# Patient Record
Sex: Female | Born: 1995 | Race: White | Hispanic: No | Marital: Single | Smoking: Never smoker
Health system: Southern US, Community
[De-identification: ages and names within clinical notes are randomized; demographics above are authoritative.]

## PROBLEM LIST (undated history)

## (undated) DIAGNOSIS — M25373 Other instability, unspecified ankle: Secondary | ICD-10-CM

## (undated) HISTORY — PX: TONSILLECTOMY: SUR1361

---
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
## 2016-07-24 ENCOUNTER — Emergency Department: Payer: BLUE CROSS/BLUE SHIELD

## 2016-07-24 ENCOUNTER — Encounter: Payer: Self-pay | Admitting: Emergency Medicine

## 2016-07-24 ENCOUNTER — Emergency Department
Admission: EM | Admit: 2016-07-24 | Discharge: 2016-07-25 | Disposition: A | Discharge status: Home / Self Care | Payer: BLUE CROSS/BLUE SHIELD | Attending: Emergency Medicine | Admitting: Emergency Medicine

## 2016-07-24 DIAGNOSIS — J4521 Mild intermittent asthma with (acute) exacerbation: Secondary | ICD-10-CM | POA: Diagnosis not present

## 2016-07-24 DIAGNOSIS — Z79899 Other long term (current) drug therapy: Secondary | ICD-10-CM | POA: Insufficient documentation

## 2016-07-24 DIAGNOSIS — R0602 Shortness of breath: Secondary | ICD-10-CM | POA: Diagnosis present

## 2016-07-24 MED ORDER — IPRATROPIUM-ALBUTEROL 0.5-2.5 (3) MG/3ML IN SOLN
RESPIRATORY_TRACT | Status: AC
  Filled 2016-07-24: qty 3

## 2016-07-24 MED ORDER — IPRATROPIUM-ALBUTEROL 0.5-2.5 (3) MG/3ML IN SOLN
3.0000 mL | Freq: Once | RESPIRATORY_TRACT | Status: AC
  Administered 2016-07-25: 3 mL via RESPIRATORY_TRACT

## 2016-07-24 MED ORDER — PREDNISONE 20 MG PO TABS
ORAL_TABLET | ORAL | Status: AC
  Administered 2016-07-25: 60 mg via ORAL
  Filled 2016-07-24: qty 3

## 2016-07-24 MED ORDER — IPRATROPIUM-ALBUTEROL 0.5-2.5 (3) MG/3ML IN SOLN
3.0000 mL | Freq: Once | RESPIRATORY_TRACT | Status: AC
  Administered 2016-07-24: 3 mL via RESPIRATORY_TRACT

## 2016-07-24 MED ORDER — IPRATROPIUM-ALBUTEROL 0.5-2.5 (3) MG/3ML IN SOLN
RESPIRATORY_TRACT | Status: AC
  Administered 2016-07-25: 3 mL via RESPIRATORY_TRACT
  Filled 2016-07-24: qty 3

## 2016-07-24 MED ORDER — PREDNISONE 20 MG PO TABS
60.0000 mg | ORAL_TABLET | Freq: Once | ORAL | Status: AC
  Administered 2016-07-25: 60 mg via ORAL

## 2016-07-24 NOTE — ED Notes (Signed)
Pt reports hx of asthma. Pt c/o SOB beginning yesterday with throat/chest tightness. Pt c/o productive cough beginning today. Pt reports she did not look at sputum, so cannot report the color.  Pt reports she has been taking albuterol treatments and inhaler at home with no relief. Pt was given duoneb in triage - pt still has inspiratory wheeze.

## 2016-07-24 NOTE — ED Triage Notes (Signed)
Pt presents to ED with an asthma attack. Pt states she first started wheezing yesterday and has used her inhaler and nebulizer all day today with no relief. symptoms worsened today. Cough present and pt reports slight nasal congestion as well. slight increased work of breathing noted at this time.

## 2016-07-25 MED ORDER — ALBUTEROL SULFATE (2.5 MG/3ML) 0.083% IN NEBU: 2.5000 mg | INHALATION_SOLUTION | RESPIRATORY_TRACT | 0 refills | Status: AC | PRN

## 2016-07-25 MED ORDER — HYDROCOD POLST-CPM POLST ER 10-8 MG/5ML PO SUER: 5.0000 mL | Freq: Two times a day (BID) | ORAL | 0 refills | Status: AC

## 2016-07-25 MED ORDER — PREDNISONE 20 MG PO TABS
ORAL_TABLET | ORAL | Status: AC
  Filled 2016-07-25: qty 1

## 2016-07-25 MED ORDER — ALBUTEROL SULFATE HFA 108 (90 BASE) MCG/ACT IN AERS: 2.0000 | INHALATION_SPRAY | RESPIRATORY_TRACT | 0 refills | Status: AC | PRN

## 2016-07-25 MED ORDER — PREDNISONE 20 MG PO TABS: ORAL_TABLET | ORAL | 0 refills | Status: DC

## 2016-07-25 MED ORDER — IPRATROPIUM-ALBUTEROL 0.5-2.5 (3) MG/3ML IN SOLN
RESPIRATORY_TRACT | Status: AC
  Administered 2016-07-25: 3 mL via RESPIRATORY_TRACT
  Filled 2016-07-25: qty 3

## 2016-07-25 MED ORDER — HYDROCOD POLST-CPM POLST ER 10-8 MG/5ML PO SUER
5.0000 mL | Freq: Once | ORAL | Status: AC
  Administered 2016-07-25: 5 mL via ORAL
  Filled 2016-07-25: qty 5

## 2016-07-25 MED ORDER — IPRATROPIUM-ALBUTEROL 0.5-2.5 (3) MG/3ML IN SOLN
3.0000 mL | Freq: Once | RESPIRATORY_TRACT | Status: AC
  Administered 2016-07-25: 3 mL via RESPIRATORY_TRACT

## 2016-07-25 NOTE — ED Notes (Addendum)
RN to room to d/c patient. Pt c/o increasing SOB. Pt had inspiratory wheezing left chest. Pt c/o left chest pain described as shooting rated as a 5 out 10. MD Dolores FrameSung notified

## 2016-07-25 NOTE — Discharge Instructions (Signed)
1. Take prednisone 60mg  daily x 4 days.  2. Use albuterol nebulizer or inhaler every 4 hours as needed for wheezing. 3. Return to the ER for worsening symptoms, persistent vomiting, difficulty breathing or other concerns.

## 2016-07-25 NOTE — ED Provider Notes (Signed)
[facility] Emergency Department Provider Note   ____________________________________________   First MD Initiated Contact with Patient 07/25/16 0018     (approximate)  I have reviewed the triage vital signs and the nursing notes.   HISTORY  Chief Complaint Asthma    HPI Suzanne Hancock is a 20 y.o. female who presents to the ED from home with a chief complaint of asthma exacerbation. Patient reports she started wheezing with shortness of breath yesterday and has been using her inhaler and nebulizer without relief. Complains of dry cough and nasal congestion. Denies associated fever, chills, chest pain, abdominal pain, nausea, vomiting, diarrhea. Denies recent travel or trauma. Nothing makes her symptoms better or worse.Last hospitalization for asthma as a child. Never intubated.   Past Medical History:  Diagnosis Date  . Asthma    daily inhaler  . 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   Past Surgical History:  Procedure Laterality Date  . ANKLE RECONSTRUCTION  09/08/2011   Procedure: RECONSTRUCTION ANKLE;  Surgeon: Harvie JuniorJohn L Graves;  Location: [facility];  Service: Orthopedics;  Laterality: Left;  brostrom procedure   . TONSILLECTOMY      Prior to Admission medications   Medication Sig Start Date End Date Taking? Authorizing Provider  fluticasone (FLOVENT HFA) 110 MCG/ACT inhaler Inhale 2 puffs into the lungs daily.     Yes Historical Provider, MD  Norethindrone Acetate-Ethinyl Estradiol (GILDESS 1.5/30) 1.5-30 MG-MCG tablet Take 1 tablet by mouth daily.   Yes Historical Provider, MD  albuterol (PROVENTIL HFA;VENTOLIN HFA) 108 (90 Base) MCG/ACT inhaler Inhale 2 puffs into the lungs every 4 (four) hours as needed for wheezing or shortness of breath. 07/25/16   Irean HongJade J Dodi Leu, MD  albuterol (PROVENTIL) (2.5 MG/3ML) 0.083% nebulizer solution Take 3 mLs (2.5 mg total) by nebulization  every 4 (four) hours as needed for wheezing or shortness of breath. 07/25/16   Irean HongJade J Enzley Kitchens, MD  FLUARIX QUADRIVALENT 0.5 ML injection Inject 1 Dose into the muscle once. 05/23/16   Historical Provider, MD  predniSONE (DELTASONE) 20 MG tablet 3 tablets daily x 4 days 07/25/16   Irean HongJade J Legaci Tarman, MD    Allergies Other and Penicillins  Family History  Problem Relation Age of Onset  . Hypertension Mother   . Asthma Mother     as a child    Social History Social History  Substance Use Topics  . Smoking status: Never Smoker  . Smokeless tobacco: Never Used  . Alcohol use Yes    Review of Systems  Constitutional: No fever/chills. Eyes: No visual changes. ENT: No sore throat. Cardiovascular: Denies chest pain. Respiratory: Positive for dry cough, wheezing and shortness of breath. Gastrointestinal: No abdominal pain.  No nausea, no vomiting.  No diarrhea.  No constipation. Genitourinary: Negative for dysuria. Musculoskeletal: Negative for back pain. Skin: Negative for rash. Neurological: Negative for headaches, focal weakness or numbness.  10-point ROS otherwise negative.  ____________________________________________   PHYSICAL EXAM:  VITAL SIGNS: ED Triage Vitals  Enc Vitals Group     BP 07/24/16 2145 133/83     Pulse Rate 07/24/16 2145 90     Resp 07/24/16 2145 16     Temp 07/24/16 2145 98.2 F (36.8 C)     Temp Source 07/24/16 2145 Oral     SpO2 07/24/16 2145 97 %     Weight 07/24/16 2143 120 lb (54.4 kg)  Height 07/24/16 2143 5' 1.5" (1.562 m)     Head Circumference --      Peak Flow --      Pain Score 07/24/16 2145 0     Pain Loc --      Pain Edu? --      Excl. in GC? --    Examined after second DuoNeb treatment: Constitutional: Alert and oriented. Well appearing and in no acute distress. Eyes: Conjunctivae are normal. PERRL. EOMI. Head: Atraumatic. Nose: No congestion/rhinnorhea. Mouth/Throat: Mucous membranes are moist.  Oropharynx  non-erythematous. Neck: No stridor.   Cardiovascular: Mildly tachycardic rate, regular rhythm. Grossly normal heart sounds.  Good peripheral circulation. Respiratory: Normal respiratory effort.  No retractions. Lungs CTAB without wheezing. Gastrointestinal: Soft and nontender. No distention. No abdominal bruits. No CVA tenderness. Musculoskeletal: No lower extremity tenderness nor edema.  No joint effusions. Neurologic:  Normal speech and language. No gross focal neurologic deficits are appreciated. No gait instability. Skin:  Skin is warm, dry and intact. No rash noted. Psychiatric: Mood and affect are normal. Speech and behavior are normal.  ____________________________________________   LABS (all labs ordered are listed, but only abnormal results are displayed)  Labs Reviewed - No data to display ____________________________________________  EKG  None ____________________________________________  RADIOLOGY  Chest 2 view (viewed by me, interpreted per Dr. Earlene Plateravis): No active cardiopulmonary disease. ____________________________________________   PROCEDURES  Procedure(s) performed: None  Procedures  Critical Care performed: No  ____________________________________________   INITIAL IMPRESSION / ASSESSMENT AND PLAN / ED COURSE  Pertinent labs & imaging results that were available during my care of the patient were reviewed by me and considered in my medical decision making (see chart for details).  20 year old female with a history of asthma who presents with exacerbation. She is feeling and sounding better after second DuoNeb. Received 60mg  oral prednisone. We'll continue to monitor and reassess.  Clinical Course as of Jul 26 707  Wynelle LinkSun Jul 25, 2016  0123 Lungs remain clear with good aeration without wheezing. Room air saturations 98%. Will continue patient on prednisone for a 5 day burst. Will refill prescriptions for albuterol nebulizer and inhaler. Strict return  precautions given. Patient verbalizes understanding and agrees with plan of care.  [JS]  0342 Patient's discharge was held secondary to recurrent wheezing. She had a third DuoNeb and observed for 2 additional hours. Overall she is feeling better; I did offer hospitalization but patient desires to go home. Strict return precautions given. Patient verbalizes understanding and agrees with plan of care.  [JS]    Clinical Course User Index [JS] Irean HongJade J Tekeya Geffert, MD     ____________________________________________   FINAL CLINICAL IMPRESSION(S) / ED DIAGNOSES  Final diagnoses:  Mild intermittent asthma with exacerbation      NEW MEDICATIONS STARTED DURING THIS VISIT:  New Prescriptions   ALBUTEROL (PROVENTIL HFA;VENTOLIN HFA) 108 (90 BASE) MCG/ACT INHALER    Inhale 2 puffs into the lungs every 4 (four) hours as needed for wheezing or shortness of breath.   ALBUTEROL (PROVENTIL) (2.5 MG/3ML) 0.083% NEBULIZER SOLUTION    Take 3 mLs (2.5 mg total) by nebulization every 4 (four) hours as needed for wheezing or shortness of breath.   PREDNISONE (DELTASONE) 20 MG TABLET    3 tablets daily x 4 days     Note:  This document was prepared using Dragon voice recognition software and may include unintentional dictation errors.    Irean HongJade J Salimatou Simone, MD 07/25/16 [phone]

## 2016-07-25 NOTE — ED Notes (Signed)
Report from jenna, rn.  

## 2016-07-25 NOTE — ED Notes (Signed)
Pt with clear breath sounds in all lung fields with exception of beginning of branch of bronchi. Pt states she "feels the same". Skin pwd, resps are unlabored at this time. Pt denies further needs.

## 2019-08-26 ENCOUNTER — Encounter: Payer: Self-pay | Admitting: Emergency Medicine

## 2019-08-26 ENCOUNTER — Emergency Department: Payer: Managed Care, Other (non HMO)

## 2019-08-26 ENCOUNTER — Other Ambulatory Visit: Payer: Self-pay

## 2019-08-26 DIAGNOSIS — Z20828 Contact with and (suspected) exposure to other viral communicable diseases: Secondary | ICD-10-CM | POA: Insufficient documentation

## 2019-08-26 DIAGNOSIS — Z79899 Other long term (current) drug therapy: Secondary | ICD-10-CM | POA: Insufficient documentation

## 2019-08-26 DIAGNOSIS — M79662 Pain in left lower leg: Secondary | ICD-10-CM | POA: Diagnosis not present

## 2019-08-26 DIAGNOSIS — B349 Viral infection, unspecified: Secondary | ICD-10-CM | POA: Diagnosis not present

## 2019-08-26 DIAGNOSIS — J45901 Unspecified asthma with (acute) exacerbation: Secondary | ICD-10-CM | POA: Diagnosis not present

## 2019-08-26 DIAGNOSIS — R079 Chest pain, unspecified: Secondary | ICD-10-CM | POA: Diagnosis present

## 2019-08-26 LAB — BASIC METABOLIC PANEL
Anion gap: 9 (ref 5–15)
BUN: 6 mg/dL (ref 6–20)
CO2: 25 mmol/L (ref 22–32)
Calcium: 9.1 mg/dL (ref 8.9–10.3)
Chloride: 106 mmol/L (ref 98–111)
Creatinine, Ser: 0.68 mg/dL (ref 0.44–1.00)
GFR calc Af Amer: >60 mL/min (ref >60)
GFR calc non Af Amer: >60 mL/min (ref >60)
Glucose, Bld: 97 mg/dL (ref 70–99)
Potassium: 3.7 mmol/L (ref 3.5–5.1)
Sodium: 140 mmol/L (ref 135–145)

## 2019-08-26 LAB — POCT PREGNANCY, URINE: Preg Test, Ur: NEGATIVE

## 2019-08-26 LAB — CBC
HCT: 42.6 % (ref 36.0–46.0)
Hemoglobin: 14.5 g/dL (ref 12.0–15.0)
MCH: 28.5 pg (ref 26.0–34.0)
MCHC: 34 g/dL (ref 30.0–36.0)
MCV: 83.7 fL (ref 80.0–100.0)
Platelets: 355 10*3/uL (ref 150–400)
RBC: 5.09 MIL/uL (ref 3.87–5.11)
RDW: 12.1 % (ref 11.5–15.5)
WBC: 9.2 10*3/uL (ref 4.0–10.5)
nRBC: 0 % (ref 0.0–0.2)

## 2019-08-26 LAB — TROPONIN I (HIGH SENSITIVITY): Troponin I (High Sensitivity): <2 ng/L (ref <18)

## 2019-08-26 MED ORDER — SODIUM CHLORIDE 0.9% FLUSH: 3.0000 mL | Freq: Once | INTRAVENOUS | Status: DC

## 2019-08-26 NOTE — ED Triage Notes (Addendum)
Pt via POV from home c/o L sided CP, SOB, and fatigue that started around 1430 today.    Pt NAD and A&Ox4 at this time

## 2019-08-27 ENCOUNTER — Emergency Department
Admission: EM | Admit: 2019-08-27 | Discharge: 2019-08-27 | Disposition: A | Discharge status: Home / Self Care | Payer: Managed Care, Other (non HMO) | Attending: Emergency Medicine | Admitting: Emergency Medicine

## 2019-08-27 ENCOUNTER — Emergency Department: Payer: Managed Care, Other (non HMO)

## 2019-08-27 DIAGNOSIS — B349 Viral infection, unspecified: Secondary | ICD-10-CM

## 2019-08-27 DIAGNOSIS — J45901 Unspecified asthma with (acute) exacerbation: Secondary | ICD-10-CM

## 2019-08-27 DIAGNOSIS — Z20822 Contact with and (suspected) exposure to covid-19: Secondary | ICD-10-CM

## 2019-08-27 LAB — TROPONIN I (HIGH SENSITIVITY): Troponin I (High Sensitivity): <2 ng/L (ref <18)

## 2019-08-27 LAB — FIBRIN DERIVATIVES D-DIMER (ARMC ONLY): Fibrin derivatives D-dimer (ARMC): 281.02 ng/mL (FEU) (ref 0.00–499.00)

## 2019-08-27 LAB — SARS CORONAVIRUS 2 (TAT 6-24 HRS): SARS Coronavirus 2: NEGATIVE

## 2019-08-27 MED ORDER — PREDNISONE 20 MG PO TABS: 60.0000 mg | ORAL_TABLET | Freq: Every day | ORAL | 0 refills | Status: AC

## 2019-08-27 MED ORDER — PREDNISONE 20 MG PO TABS
60.0000 mg | ORAL_TABLET | Freq: Once | ORAL | Status: AC
  Administered 2019-08-27: 60 mg via ORAL
  Filled 2019-08-27: qty 3

## 2019-08-27 MED ORDER — IPRATROPIUM-ALBUTEROL 0.5-2.5 (3) MG/3ML IN SOLN
3.0000 mL | Freq: Once | RESPIRATORY_TRACT | Status: AC
  Administered 2019-08-27: 3 mL via RESPIRATORY_TRACT
  Filled 2019-08-27: qty 3

## 2019-08-27 NOTE — ED Provider Notes (Signed)
[facility] Emergency Department Provider Note  ____________________________________________  Time seen: Approximately 1:50 AM  I have reviewed the triage vital signs and the nursing notes.   HISTORY  Chief Complaint Chest Pain   HPI Suzanne Hancock is a 23 y.o. female presents for evaluation of chest pain.  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  She was also with her brother recently who tested positive for Covid.  She has had a week of a dry cough.  Since last night has been having wheezing, shortness of breath, and woke up at least 3 times to use her inhaler.  Her symptoms continued this morning and she started having chest pain which prompted her visit to the emergency room.  She reports that the pain initially was sharp located on the right side but currently is more soreness and goes to her upper back as well.  No pleuritic chest pain.  She has mild shortness of breath associated with it.  No fever, loss of taste or smell, body aches, chills, nausea, vomiting.  She does report having diarrhea that started today.  She denies any personal or family history of blood clots.  She is on birth control.  She reports mild intermittent left calf pain that she describes as soreness which she has had intermittently for several months.   Past Medical History:  Diagnosis Date  . Asthma    daily inhaler  . 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   Past Surgical History:  Procedure Laterality Date  . ANKLE RECONSTRUCTION  09/08/2011   Procedure: RECONSTRUCTION ANKLE;  Surgeon: Alta Corning;  Location: [facility];  Service: Orthopedics;  Laterality: Left;  brostrom procedure   . TONSILLECTOMY      Prior to Admission medications   Medication Sig Start Date End Date Taking? Authorizing Provider  albuterol (PROVENTIL HFA;VENTOLIN HFA) 108 (90 Base) MCG/ACT inhaler Inhale 2 puffs into  the lungs every 4 (four) hours as needed for wheezing or shortness of breath. 07/25/16   Paulette Blanch, MD  albuterol (PROVENTIL) (2.5 MG/3ML) 0.083% nebulizer solution Take 3 mLs (2.5 mg total) by nebulization every 4 (four) hours as needed for wheezing or shortness of breath. 07/25/16   Paulette Blanch, MD  chlorpheniramine-HYDROcodone (TUSSIONEX PENNKINETIC ER) 10-8 MG/5ML SUER Take 5 mLs by mouth 2 (two) times daily. 07/25/16   Paulette Blanch, MD  FLUARIX QUADRIVALENT 0.5 ML injection Inject 1 Dose into the muscle once. 05/23/16   [provider]  fluticasone (FLOVENT HFA) 110 MCG/ACT inhaler Inhale 2 puffs into the lungs daily.      [provider]  Norethindrone Acetate-Ethinyl Estradiol (GILDESS 1.5/30) 1.5-30 MG-MCG tablet Take 1 tablet by mouth daily.    [provider]  predniSONE (DELTASONE) 20 MG tablet Take 3 tablets (60 mg total) by mouth daily for 4 days. 08/27/19 08/31/19  Rudene Re, MD    Allergies Other and Penicillins  Family History  Problem Relation Age of Onset  . Hypertension Mother   . Asthma Mother        as a child    Social History Social History   Tobacco Use  . Smoking status: Never Smoker  . Smokeless tobacco: Never Used  Substance Use Topics  . Alcohol use: Yes  . Drug use: No    Review of Systems  Constitutional: Negative for fever. Eyes: Negative for  visual changes. ENT: Negative for sore throat. Neck: No neck pain  Cardiovascular: + chest pain. Respiratory: + shortness of breath, wheezing, cough Gastrointestinal: Negative for abdominal pain, vomiting. + diarrhea. Genitourinary: Negative for dysuria. Musculoskeletal: Negative for back pain. + L calf soreness Skin: Negative for rash. Neurological: Negative for headaches, weakness or numbness. Psych: No SI or HI  ____________________________________________   PHYSICAL EXAM:  VITAL SIGNS: ED Triage Vitals  Enc Vitals Group     BP 08/26/19 1816 (!) 145/76      Pulse Rate 08/26/19 1816 72     Resp 08/26/19 1816 18     Temp 08/26/19 1816 97.9 F (36.6 C)     Temp Source 08/26/19 1816 Oral     SpO2 08/26/19 1816 100 %     Weight 08/26/19 1817 135 lb (61.2 kg)     Height 08/26/19 1817  (1.549 m)     Head Circumference --      Peak Flow --      Pain Score 08/26/19 1817 7     Pain Loc --      Pain Edu? --      Excl. in GC? --     Constitutional: Alert and oriented. Well appearing and in no apparent distress. HEENT:      Head: Normocephalic and atraumatic.         Eyes: Conjunctivae are normal. Sclera is non-icteric.       Mouth/Throat: Mucous membranes are moist.       Neck: Supple with no signs of meningismus. Cardiovascular: Regular rate and rhythm. No murmurs, gallops, or rubs. 2+ symmetrical distal pulses are present in all extremities. No JVD. Respiratory: Normal respiratory effort.  Normal sats, slightly decreased air movement bilaterally with no wheezing or crackles.   Gastrointestinal: Soft, non tender, and non distended with positive bowel sounds. No rebound or guarding. Musculoskeletal: Nontender with normal range of motion in all extremities. No edema, cyanosis, or erythema of extremities. Neurologic: Normal speech and language. Face is symmetric. Moving all extremities. No gross focal neurologic deficits are appreciated. Skin: Skin is warm, dry and intact. No rash noted. Psychiatric: Mood and affect are normal. Speech and behavior are normal.  ____________________________________________   LABS (all labs ordered are listed, but only abnormal results are displayed)  Labs Reviewed  SARS CORONAVIRUS 2 (TAT 6-24 HRS)  BASIC METABOLIC PANEL  CBC  FIBRIN DERIVATIVES D-DIMER ([facility] ONLY)  POCT PREGNANCY, URINE  POC URINE PREG, ED  TROPONIN I (HIGH SENSITIVITY)  TROPONIN I (HIGH SENSITIVITY)   ____________________________________________  EKG  ED ECG REPORT I, Nita Sickle, the attending physician, personally  viewed and interpreted this ECG.  Normal sinus rhythm, rate of 77, normal intervals, normal axis, no ST elevations or depressions.  Normal EKG. ____________________________________________  RADIOLOGY  I have personally reviewed the images performed during this visit and I agree with the Radiologist's read.   Interpretation by Radiologist:  DG Chest 2 View  Result Date: 08/26/2019 CLINICAL DATA:  Left-sided chest pain and shortness of breath. EXAM: CHEST - 2 VIEW COMPARISON:  07/24/2016 FINDINGS: The heart size and mediastinal contours are within normal limits. Both lungs are clear. The visualized skeletal structures are unremarkable. IMPRESSION: Normal exam. Electronically Signed   By: Francene Boyers M.D.   On: 08/26/2019 18:56   US Venous Img Lower Unilateral Left  Result Date: 08/27/2019 CLINICAL DATA:  Left leg soreness EXAM: LEFT LOWER EXTREMITY VENOUS DOPPLER ULTRASOUND TECHNIQUE: Gray-scale sonography with graded compression, as well  as color Doppler and duplex ultrasound were performed to evaluate the lower extremity deep venous systems from the level of the common femoral vein and including the common femoral, femoral, profunda femoral, popliteal and calf veins including the posterior tibial, peroneal and gastrocnemius veins when visible. Spectral Doppler was utilized to evaluate flow at rest and with distal augmentation maneuvers in the common femoral, femoral and popliteal veins. COMPARISON:  None. FINDINGS: Contralateral Common Femoral Vein: Respiratory phasicity is normal and symmetric with the symptomatic side. No evidence of thrombus. Normal compressibility. Common Femoral Vein: No evidence of thrombus. Normal compressibility, respiratory phasicity and response to augmentation. Saphenofemoral Junction: No evidence of thrombus. Normal compressibility and flow on color Doppler imaging. Profunda Femoral Vein: No evidence of thrombus. Normal compressibility and flow on color Doppler  imaging. Femoral Vein: No evidence of thrombus. Normal compressibility, respiratory phasicity and response to augmentation. Popliteal Vein: No evidence of thrombus. Normal compressibility, respiratory phasicity and response to augmentation. Calf Veins: No evidence of thrombus. Normal compressibility and flow on color Doppler imaging. Venous Reflux:  None. Other Findings:  None. IMPRESSION: No evidence of deep venous thrombosis. Electronically Signed   By: Kreg Shropshire M.D.   On: 08/27/2019 02:27     ____________________________________________   PROCEDURES  Procedure(s) performed: None Procedures Critical Care performed:  None ____________________________________________   INITIAL IMPRESSION / ASSESSMENT AND PLAN / ED COURSE   23 y.o. female presents for evaluation of chest pain, cough, SOB, wheezing, diarrhea. Patient has had recent COVID exposure and also works as a Engineer, civil (consulting) in [place].  She is well-appearing in no distress with normal work of breathing, normal sats, slightly decreased air movement bilaterally with no wheezing or crackles.  Will swab her for Covid and flu.  Chest x-ray negative for pneumonia or any findings consistent with Covid.  EKG and troponin with no signs of ischemia or myocarditis.  Labs with no electrolyte derangements, anemia, dehydration, leukocytosis.  No signs of sepsis.  Will give prednisone and DuoNeb for a mild asthma exacerbation in the setting of a viral illness.  Since patient is on OCPs and is also complaining of intermittent calf pain we will do a venous Doppler and send a D-dimer to rule out PE.    _________________________ 3:10 AM on 08/27/2019 -----------------------------------------  Patient reassessed after steroids and breathing treatment and feels markedly improved.  Repeat troponin remains negative.  D-dimer negative.  Doppler study negative for DVT.  Covid swabs pending.  Discussed quarantine until results are back and quarantine if the results  are positive with patient.  Discussed supportive care and oxygen monitoring at home if Covid positive.  Will provide patient with a prescription for prednisone for mild asthma exacerbation in the setting of a viral illness.  Discussed my standard return precautions.    As part of my medical decision making, I reviewed the following data within the electronic MEDICAL RECORD NUMBER Nursing notes reviewed and incorporated, Labs reviewed , EKG interpreted , Radiograph reviewed , Notes from prior ED visits and [place] Controlled Substance Database   Please note:  Patient was evaluated in Emergency Department today for the symptoms described in the history of present illness. Patient was evaluated in the context of the global COVID-19 pandemic, which necessitated consideration that the patient might be at risk for infection with the SARS-CoV-2 virus that causes COVID-19. Institutional protocols and algorithms that pertain to the evaluation of patients at risk for COVID-19 are in a state of rapid change based on information released by  regulatory bodies including the CDC and federal and state organizations. These policies and algorithms were followed during the patient's care in the ED.  Some ED evaluations and interventions may be delayed as a result of limited staffing during the pandemic.   ____________________________________________   FINAL CLINICAL IMPRESSION(S) / ED DIAGNOSES   Final diagnoses:  Suspected COVID-19 virus infection  Viral illness  Mild asthma exacerbation      NEW MEDICATIONS STARTED DURING THIS VISIT:  ED Discharge Orders         Ordered    predniSONE (DELTASONE) 20 MG tablet  Daily     08/27/19 0309           Note:  This document was prepared using Dragon voice recognition software and may include unintentional dictation errors.    Nita SickleVeronese, [place], MD 08/27/19 [id number]

## 2019-08-27 NOTE — Discharge Instructions (Signed)

## 2020-05-10 IMAGING — US US EXTREM LOW VENOUS*L*
1 series · 13 of 24 positions shown · non-contrast
Comparison: None.

CLINICAL DATA: Left leg soreness

EXAM:
LEFT LOWER EXTREMITY VENOUS DOPPLER ULTRASOUND
TECHNIQUE: Gray-scale sonography with graded compression, as well as color
Doppler and duplex ultrasound were performed to evaluate the lower
extremity deep venous systems from the level of the common femoral
vein and including the common femoral, femoral, profunda femoral,
popliteal and calf veins including the posterior tibial, peroneal
and gastrocnemius veins when visible. Spectral Doppler was utilized
to evaluate flow at rest and with distal augmentation maneuvers in
the common femoral, femoral and popliteal veins.

[Series 1: us extrem low venous*left* · 13 of 43 slices shown]
[im 1/43]
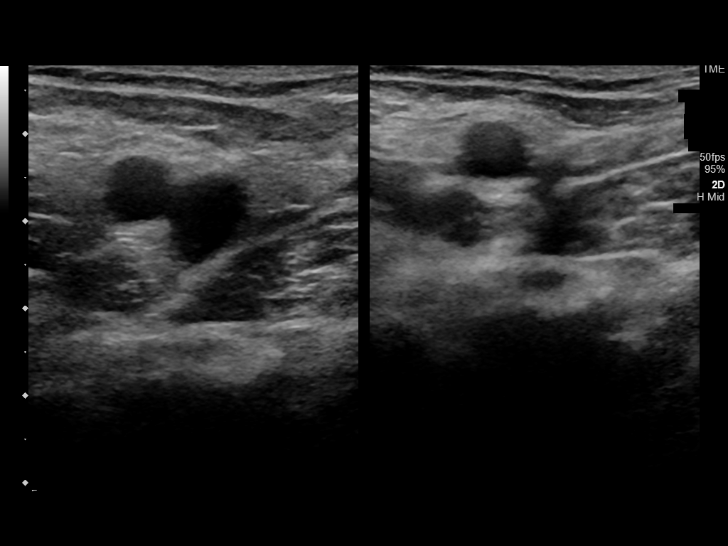
[im 4/43]
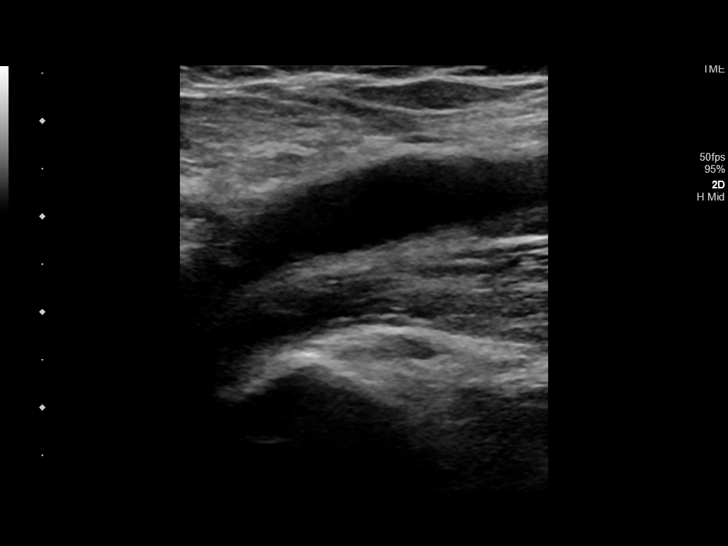
[im 8/43]
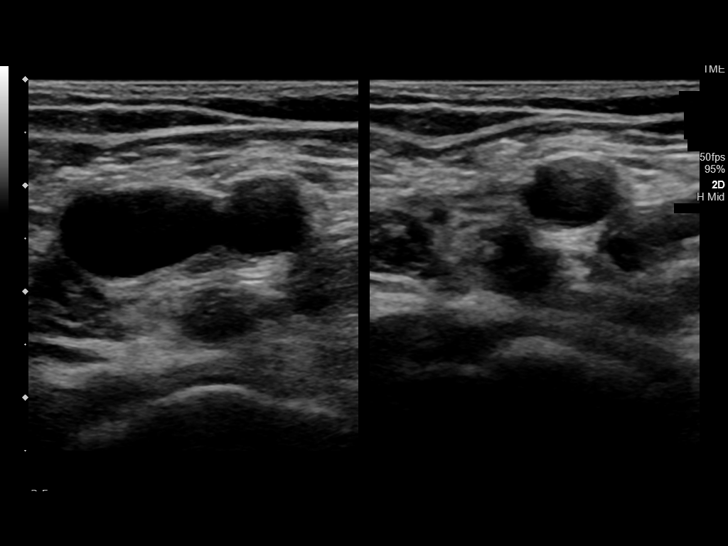
[im 11/43]
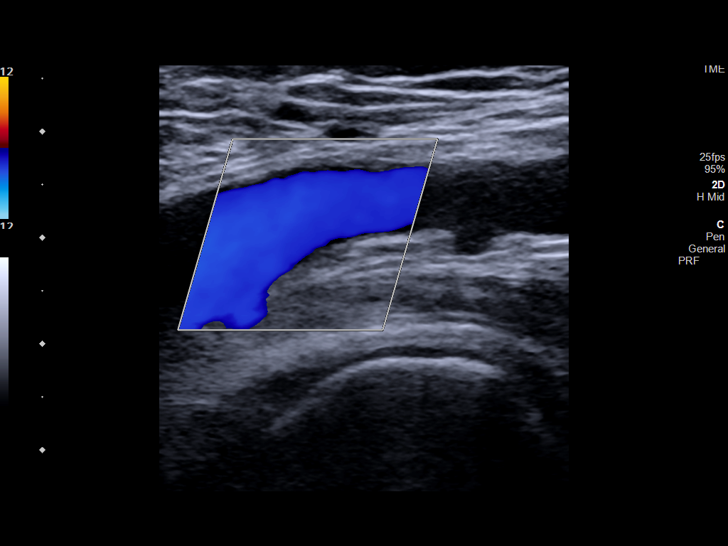
[im 15/43]
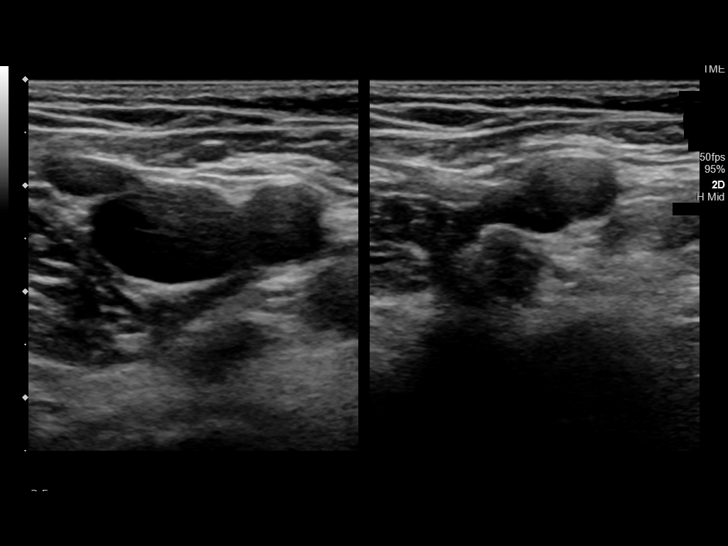
[im 19/43]
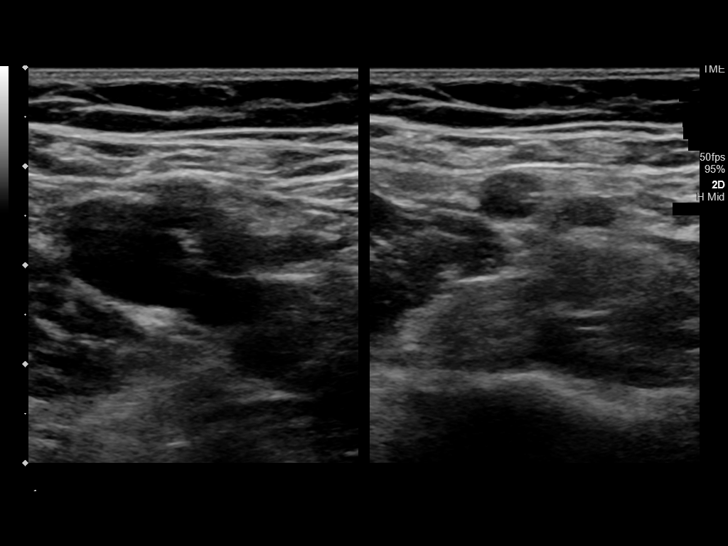
[im 22/43]
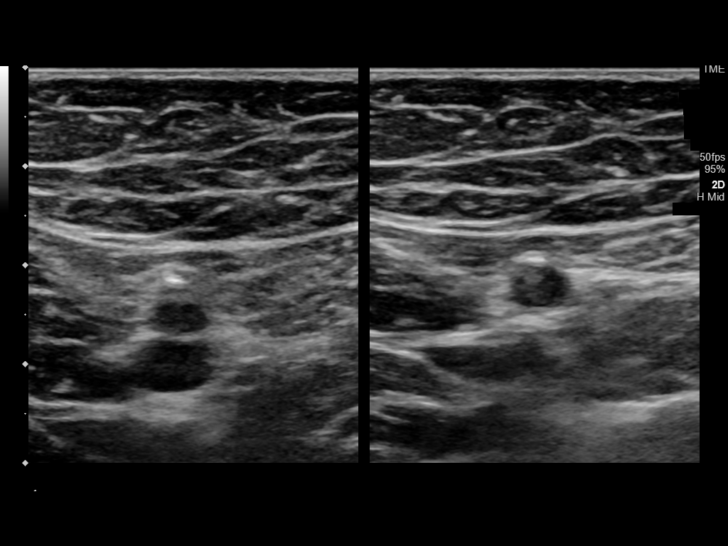
[im 24/43]
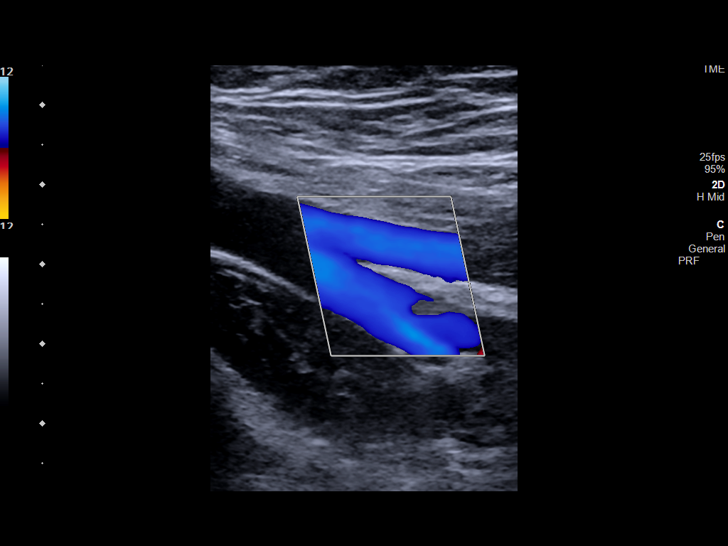
[im 28/43]
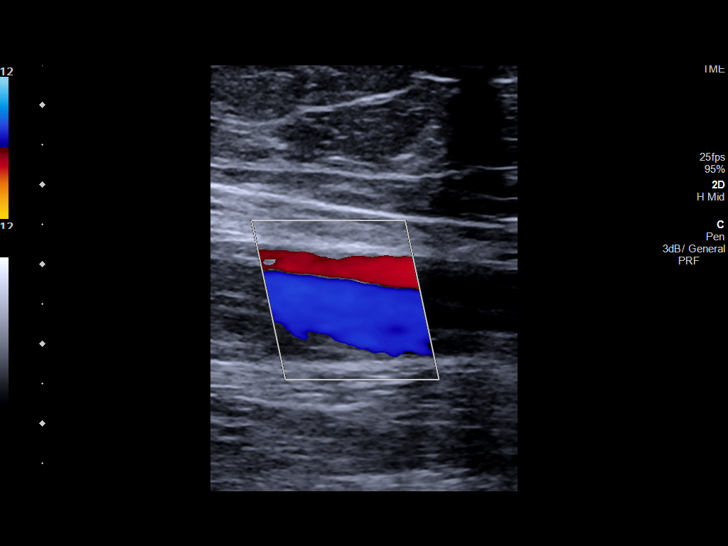
[im 32/43]
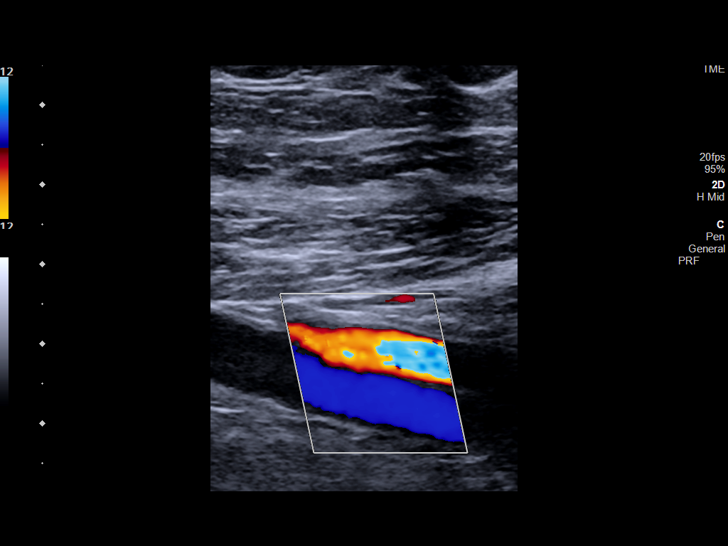
[im 35/43]
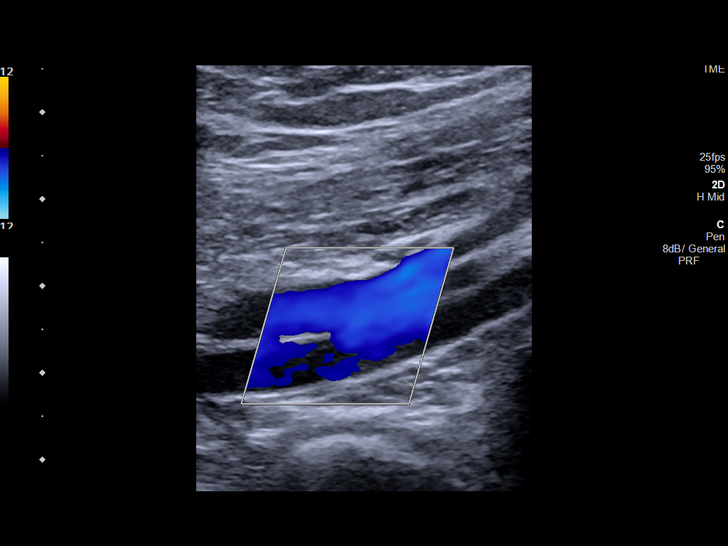
[im 39/43]
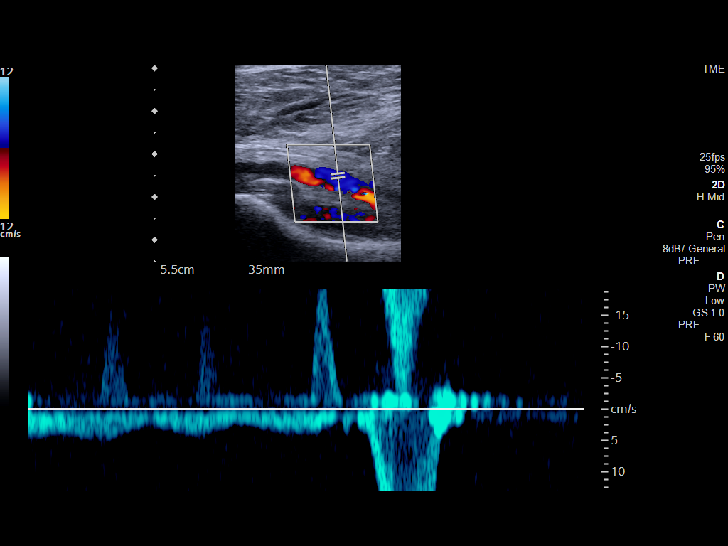
[im 43/43]
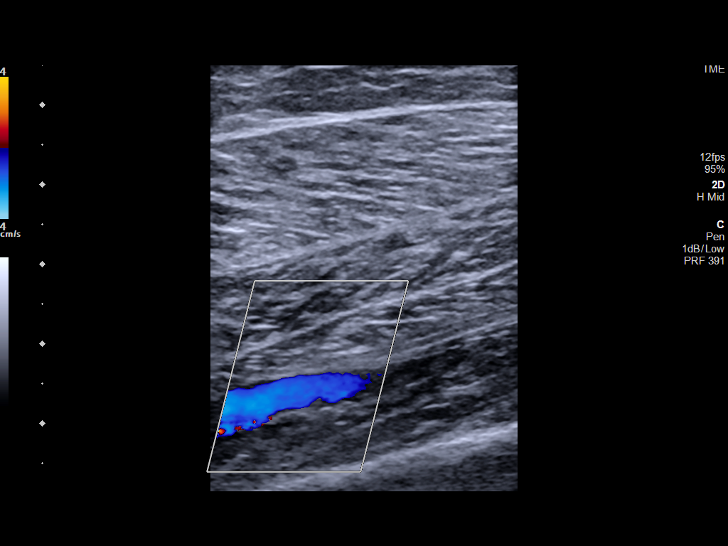

[13 of 24 positions shown; findings below may reference images not displayed]

FINDINGS: Contralateral Common Femoral Vein: Respiratory phasicity is normal
and symmetric with the symptomatic side. No evidence of thrombus.
Normal compressibility.

Common Femoral Vein: No evidence of thrombus. Normal
compressibility, respiratory phasicity and response to augmentation.

Saphenofemoral Junction: No evidence of thrombus. Normal
compressibility and flow on color Doppler imaging.

Profunda Femoral Vein: No evidence of thrombus. Normal
compressibility and flow on color Doppler imaging.

Femoral Vein: No evidence of thrombus. Normal compressibility,
respiratory phasicity and response to augmentation.

Popliteal Vein: No evidence of thrombus. Normal compressibility,
respiratory phasicity and response to augmentation.

Calf Veins: No evidence of thrombus. Normal compressibility and flow
on color Doppler imaging.

Venous Reflux:  None.

Other Findings:  None.
IMPRESSION: No evidence of deep venous thrombosis.
